# Patient Record
Sex: Male | Born: 1960 | Race: Black or African American | Hispanic: No | Marital: Single | Smoking: Never smoker
Health system: Southern US, Community
[De-identification: ages and names within clinical notes are randomized; demographics above are authoritative.]

---

## 2006-07-11 ENCOUNTER — Emergency Department (HOSPITAL_COMMUNITY): Admission: EM | Admit: 2006-07-11 | Discharge: 2006-07-11 | Payer: Self-pay | Admitting: Emergency Medicine

## 2019-03-20 ENCOUNTER — Encounter (HOSPITAL_COMMUNITY): Payer: Self-pay | Admitting: Emergency Medicine

## 2019-03-20 ENCOUNTER — Emergency Department (HOSPITAL_COMMUNITY)
Admission: EM | Admit: 2019-03-20 | Discharge: 2019-03-20 | Disposition: A | Discharge status: Home / Self Care | Payer: Self-pay | Attending: Emergency Medicine | Admitting: Emergency Medicine

## 2019-03-20 ENCOUNTER — Other Ambulatory Visit: Payer: Self-pay

## 2019-03-20 ENCOUNTER — Emergency Department (HOSPITAL_COMMUNITY): Payer: Self-pay

## 2019-03-20 DIAGNOSIS — Y281XXA Contact with knife, undetermined intent, initial encounter: Secondary | ICD-10-CM | POA: Insufficient documentation

## 2019-03-20 DIAGNOSIS — Z23 Encounter for immunization: Secondary | ICD-10-CM | POA: Insufficient documentation

## 2019-03-20 DIAGNOSIS — S61211A Laceration without foreign body of left index finger without damage to nail, initial encounter: Secondary | ICD-10-CM | POA: Insufficient documentation

## 2019-03-20 DIAGNOSIS — Y999 Unspecified external cause status: Secondary | ICD-10-CM | POA: Insufficient documentation

## 2019-03-20 DIAGNOSIS — Y939 Activity, unspecified: Secondary | ICD-10-CM | POA: Insufficient documentation

## 2019-03-20 DIAGNOSIS — Y929 Unspecified place or not applicable: Secondary | ICD-10-CM | POA: Insufficient documentation

## 2019-03-20 MED ORDER — TETANUS-DIPHTH-ACELL PERTUSSIS 5-2.5-18.5 LF-MCG/0.5 IM SUSP
0.5000 mL | Freq: Once | INTRAMUSCULAR | Status: AC
  Administered 2019-03-20: 0.5 mL via INTRAMUSCULAR
  Filled 2019-03-20: qty 0.5

## 2019-03-20 MED ORDER — TETANUS-DIPHTHERIA TOXOIDS TD 5-2 LFU IM INJ: 0.5000 mL | INJECTION | Freq: Once | INTRAMUSCULAR | Status: DC

## 2019-03-20 NOTE — ED Notes (Signed)
Provider at bedside for lac repair

## 2019-03-20 NOTE — Discharge Instructions (Addendum)
Please leave the finger in the splint.  Please wrap as it becomes bloody.  It does not seem to be healing well or you develop numbness or weakness please see the hand surgeon.

## 2019-03-20 NOTE — ED Triage Notes (Addendum)
Approx 2 inch L shaped laceration to R 2nd digit from a razor while cutting cardboard just pta.  Bandage in place on arrival.  Bleeding controlled.

## 2019-03-20 NOTE — ED Notes (Signed)
RN went over d/c paperwork with pt who verbalized understanding. Pt was alert and no distress was noted when ambulated to exit.  

## 2019-03-20 NOTE — ED Provider Notes (Signed)
[facility] DEPARTMENT Provider Note   CSN: [id number] Arrival date & time: 03/20/19  1655     History Chief Complaint  Patient presents with  . finger laceration    Robert Glass is a 58 y.o. male.  Laceration to R 2nd digit from a razor while cutting cardboard just prior to arrival.  Patient does not know when his last tetanus was.  No numbness, no weakness.  The history is provided by the patient. No language interpreter was used.  Laceration Location:  Finger Finger laceration location:  R index finger Length:  3 Depth:  Through underlying tissue Quality: avulsion   Bleeding: venous   Laceration mechanism:  Knife Pain details:    Quality:  Aching   Severity:  Mild   Timing:  Constant Foreign body present:  No foreign bodies Relieved by:  None tried Worsened by:  Movement Ineffective treatments:  None tried Tetanus status:  Unknown Associated symptoms: no fever, no numbness, no rash and no redness        History reviewed. No pertinent past medical history.  There are no problems to display for this patient.   History reviewed. No pertinent surgical history.     No family history on file.  Social History   Tobacco Use  . Smoking status: Never Smoker  . Smokeless tobacco: Never Used  Substance Use Topics  . Alcohol use: Not Currently  . Drug use: Never    Home Medications Prior to Admission medications   Not on File    Allergies    Patient has no allergy information on record.  Review of Systems   Review of Systems  Constitutional: Negative for fever.  Skin: Negative for rash.  All other systems reviewed and are negative.   Physical Exam Updated Vital Signs BP (!) 150/62 (BP Location: Left Arm) Comment: provider aware  Pulse 69   Temp 98.3 F (36.8 C) (Oral)   Resp 18   SpO2 99%   Physical Exam Vitals and nursing note reviewed.  Constitutional:      Appearance: He is well-developed.  HENT:   Head: Normocephalic.     Right Ear: External ear normal.     Left Ear: External ear normal.  Eyes:     Conjunctiva/sclera: Conjunctivae normal.  Cardiovascular:     Rate and Rhythm: Normal rate.     Heart sounds: Normal heart sounds.  Pulmonary:     Effort: Pulmonary effort is normal.     Breath sounds: Normal breath sounds.  Abdominal:     General: Bowel sounds are normal.     Palpations: Abdomen is soft.  Musculoskeletal:        General: Normal range of motion.     Cervical back: Normal range of motion and neck supple.     Comments: 3 cm laceration to the right index finger near the middle finger lateral portion.  Skin:    General: Skin is warm and dry.     Capillary Refill: Capillary refill takes less than 2 seconds.  Neurological:     General: No focal deficit present.     Mental Status: He is alert and oriented to person, place, and time.     ED Results / Procedures / Treatments   Labs (all labs ordered are listed, but only abnormal results are displayed) Labs Reviewed - No data to display  EKG None  Radiology DG Finger Index Right  Result Date: 03/20/2019 CLINICAL DATA:  Right second digit laceration  EXAM: RIGHT INDEX FINGER 2+V COMPARISON:  None. FINDINGS: There is no evidence of fracture or dislocation. There is no evidence of arthropathy or other focal bone abnormality. Soft tissues are unremarkable. IMPRESSION: No acute osseous injury of the right second digit. Electronically Signed   By: Elige Ko   On: 03/20/2019 17:52    Procedures .Marland KitchenLaceration Repair  Date/Time: 03/20/2019 11:18 PM Performed by: Niel Hummer, MD Authorized by: Niel Hummer, MD   Consent:    Consent obtained:  Verbal   Consent given by:  Parent   Risks discussed:  Infection, poor wound healing and poor cosmetic result Anesthesia (see MAR for exact dosages):    Anesthesia method:  None Laceration details:    Length (cm):  3 Repair type:    Repair type:  Simple Treatment:     Area cleansed with:  Saline   Amount of cleaning:  Standard Skin repair:    Repair method:  Tissue adhesive Post-procedure details:    Dressing:  Bulky dressing and splint for protection   Patient tolerance of procedure:  Tolerated well, no immediate complications   (including critical care time)  Medications Ordered in ED Medications  Tdap (BOOSTRIX) injection 0.5 mL (0.5 mLs Intramuscular Given 03/20/19 2253)    ED Course  I have reviewed the triage vital signs and the nursing notes.  Pertinent labs & imaging results that were available during my care of the patient were reviewed by me and considered in my medical decision making (see chart for details).    MDM Rules/Calculators/A&P                      58 year old male with laceration to the right index finger from a knife.  Unknown tetanus status so we will update.  Wound cleaned and closed with Dermabond.  Patient's wound wrapped in bulky dressing and finger splint applied.  Will have patient follow-up with PCP or ED if worsening.  Discussed signs of infection that warrant reevaluation.     Final Clinical Impression(s) / ED Diagnoses Final diagnoses:  Laceration of left index finger without foreign body without damage to nail, initial encounter    Rx / DC Orders ED Discharge Orders    None       Niel Hummer, MD 03/20/19 2322

## 2019-03-20 NOTE — ED Notes (Signed)
Provider at bedside

## 2019-03-20 NOTE — ED Notes (Signed)
Ortho at bedside.

## 2020-04-09 ENCOUNTER — Other Ambulatory Visit: Payer: Self-pay

## 2020-04-09 DIAGNOSIS — Z20822 Contact with and (suspected) exposure to covid-19: Secondary | ICD-10-CM

## 2020-04-11 LAB — NOVEL CORONAVIRUS, NAA: SARS-CoV-2, NAA: NOT DETECTED

## 2020-04-11 LAB — SARS-COV-2, NAA 2 DAY TAT

## 2020-11-21 IMAGING — DX DG FINGER INDEX 2+V*R*
3 series · 3 of 3 positions shown · non-contrast
Comparison: None.

CLINICAL DATA: Right second digit laceration

EXAM:
RIGHT INDEX FINGER 2+V

[x finger pa right]
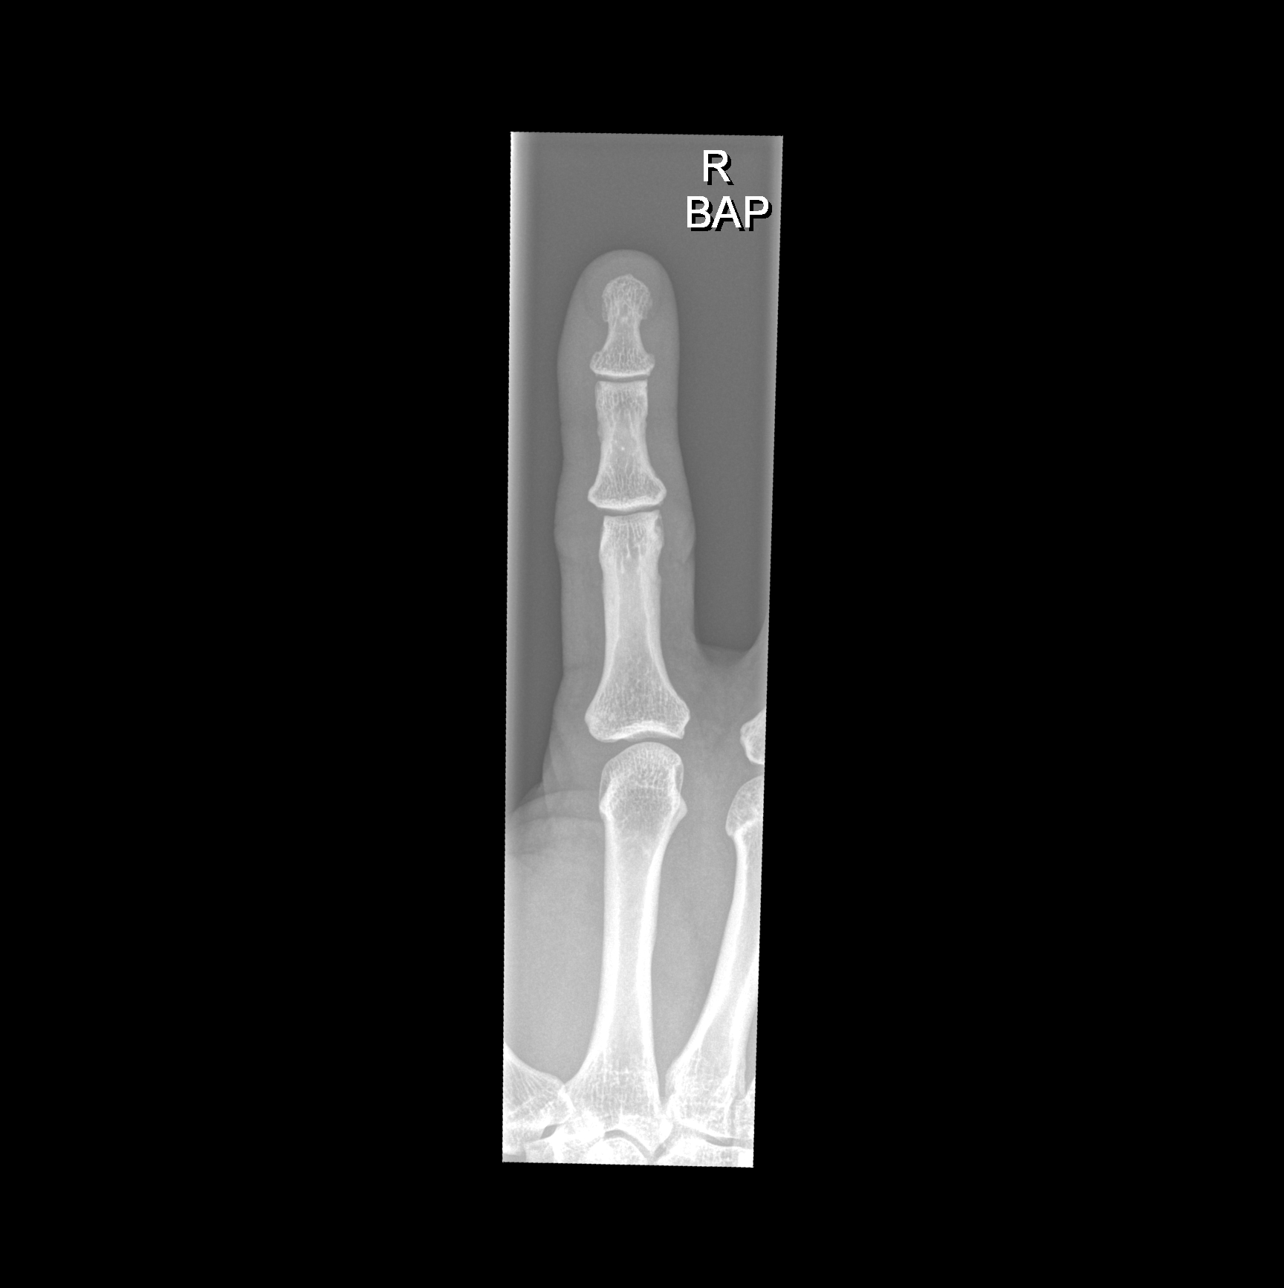

[x finger obl right]
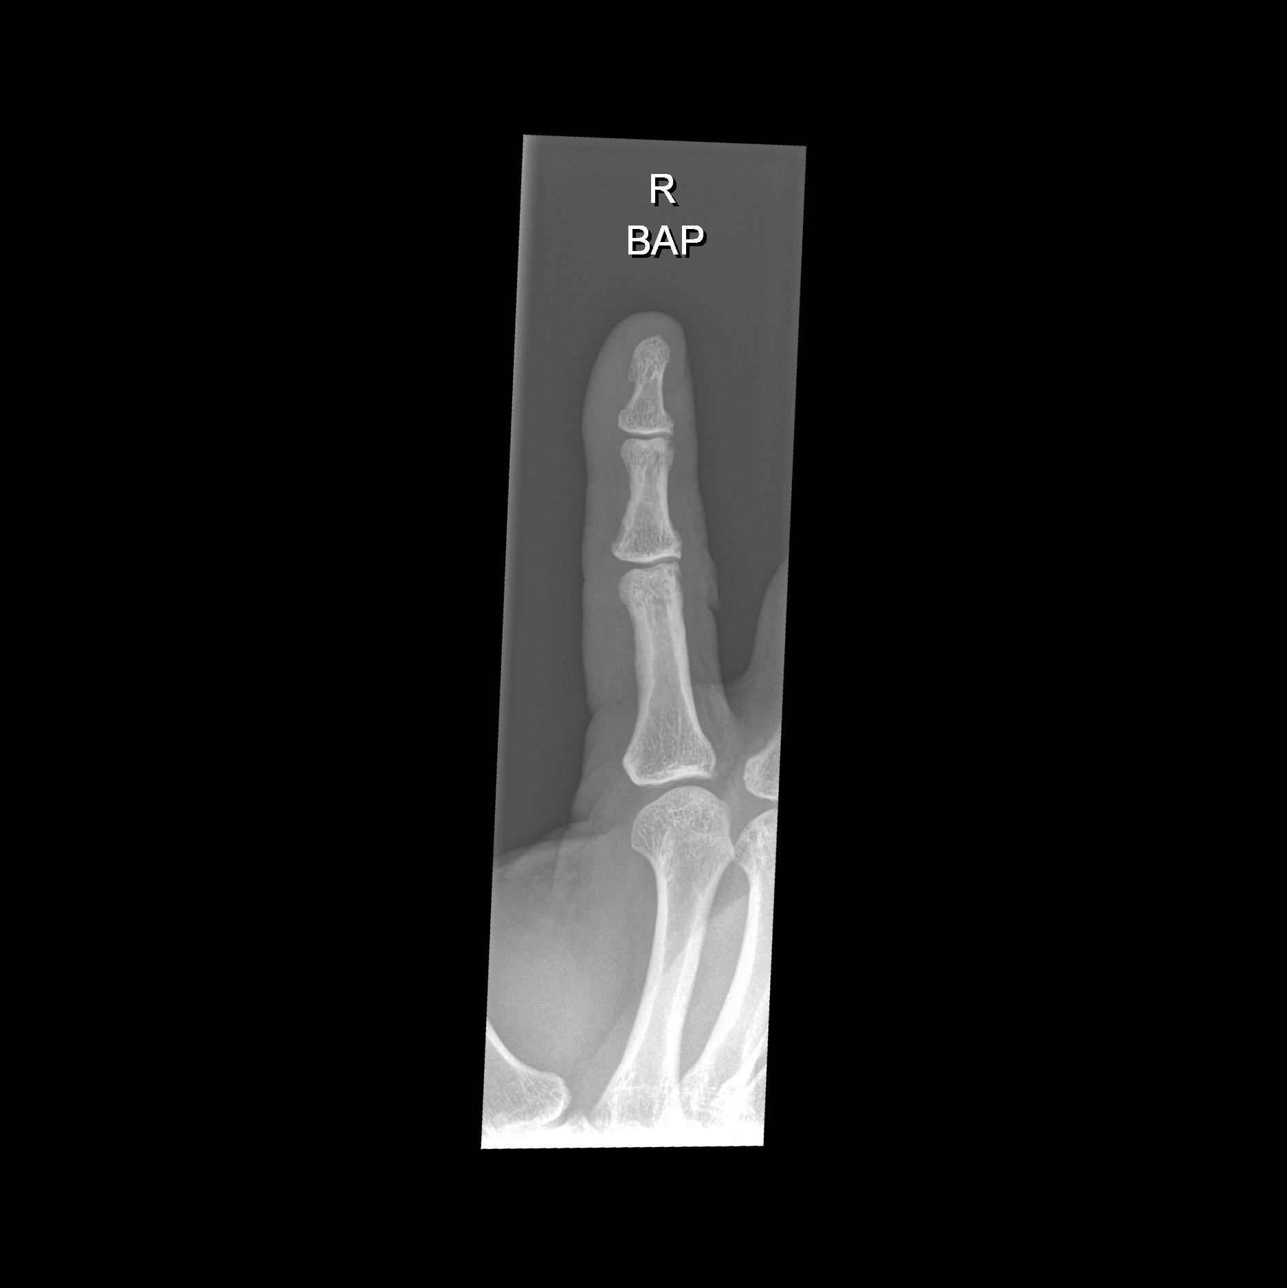

[x finger lat right]
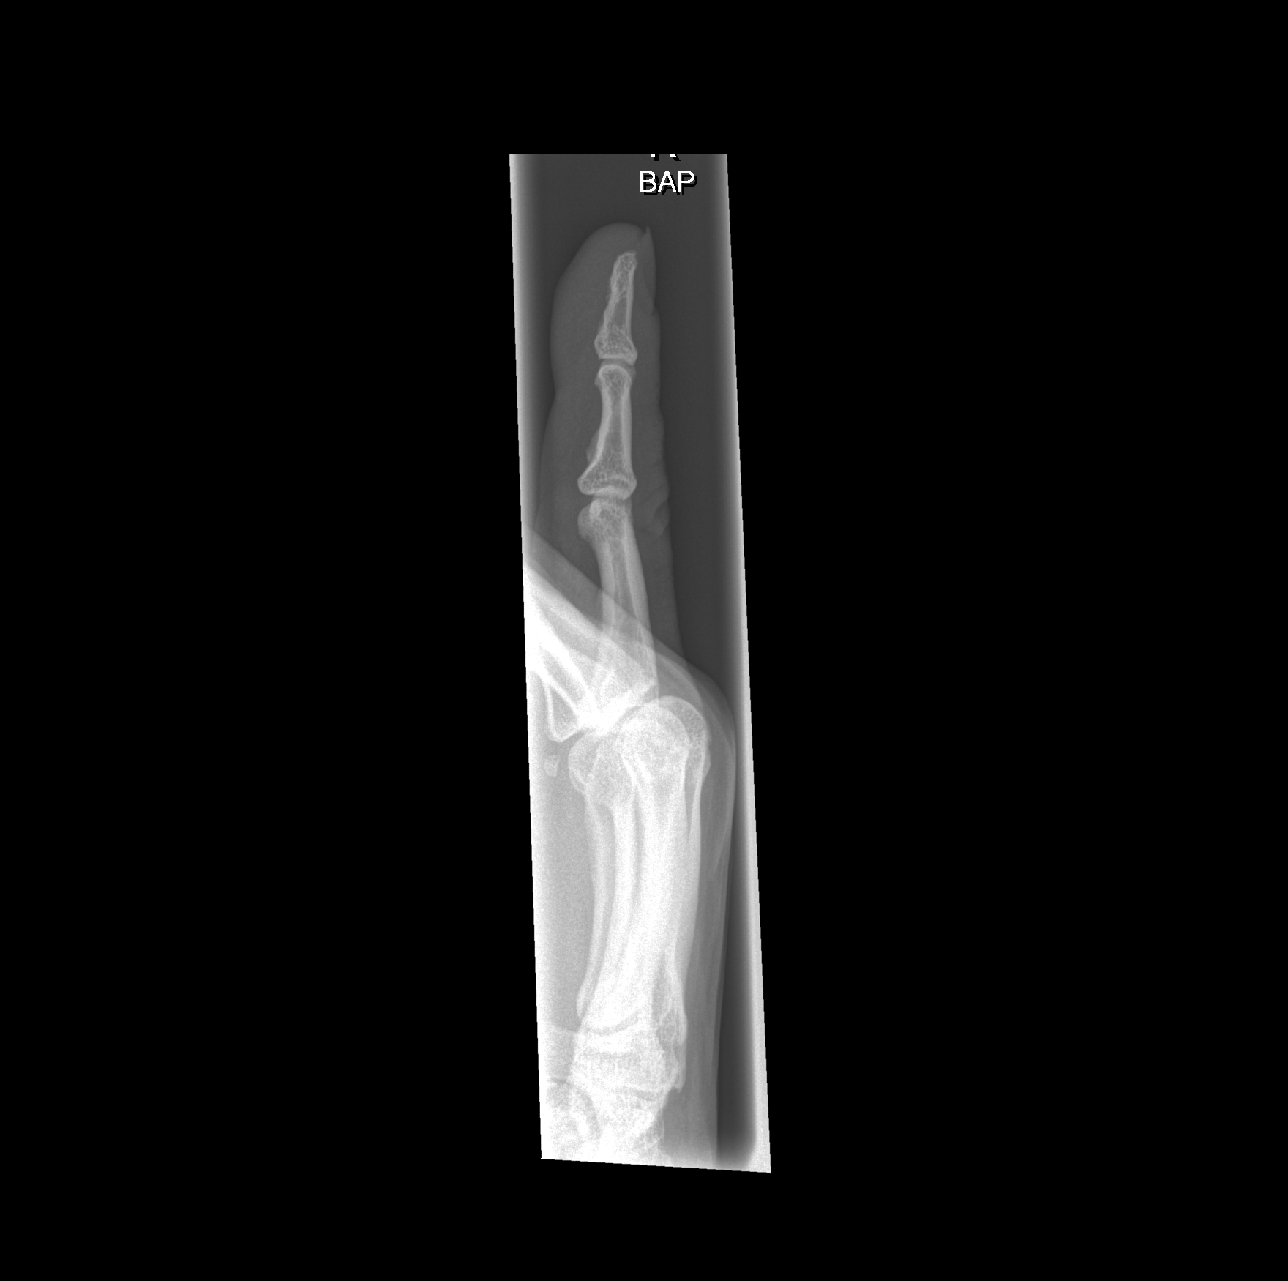

[3 of 3 positions shown; findings below may reference images not displayed]

FINDINGS: There is no evidence of fracture or dislocation. There is no
evidence of arthropathy or other focal bone abnormality. Soft
tissues are unremarkable.
IMPRESSION: No acute osseous injury of the right second digit.

## 2023-07-19 ENCOUNTER — Other Ambulatory Visit (HOSPITAL_COMMUNITY): Payer: Self-pay
